# Patient Record
Sex: Female | Born: 1960 | Race: White | Hispanic: No | Marital: Married
Health system: Southern US, Community
[De-identification: ages and names within clinical notes are randomized; demographics above are authoritative.]

---

## 2007-05-27 ENCOUNTER — Ambulatory Visit: Payer: Self-pay | Admitting: Podiatry

## 2010-05-24 ENCOUNTER — Ambulatory Visit: Payer: Self-pay | Admitting: Family Medicine

## 2010-09-14 ENCOUNTER — Emergency Department: Payer: Self-pay | Admitting: Emergency Medicine

## 2011-06-08 ENCOUNTER — Emergency Department: Payer: Self-pay | Admitting: Emergency Medicine

## 2011-11-30 IMAGING — US US EXTREM LOW VENOUS*R*
1 series · 14 of 24 positions shown · non-contrast
Comparison: none

REASON FOR EXAM: CELLULITIS R LEG PAIN EVAL FOR DVT
COMMENTS:

[Series 1: us extrem low venous*right* · 14 of 34 slices shown]
[im 1/34]
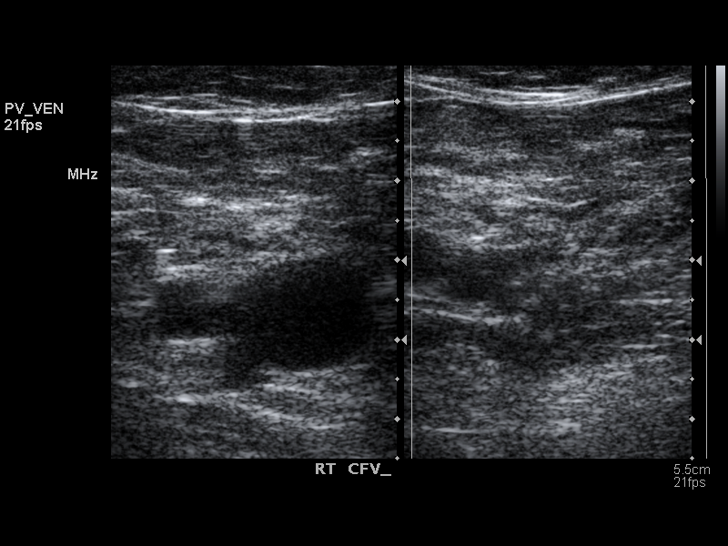
[im 3/34]
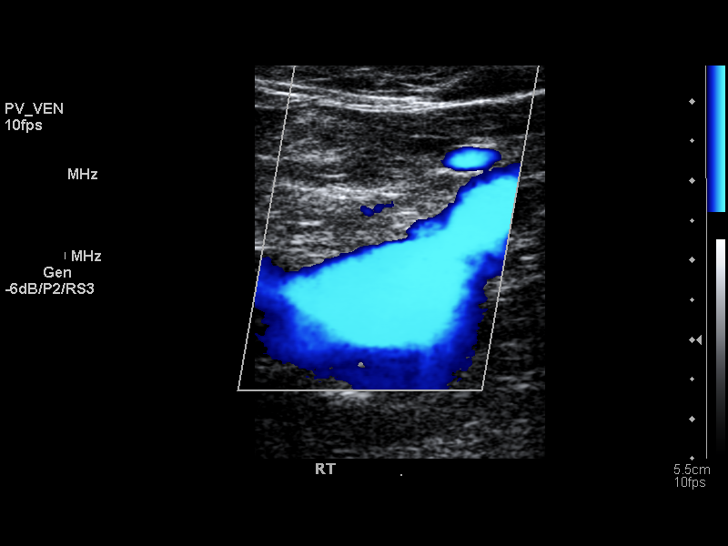
[im 6/34]
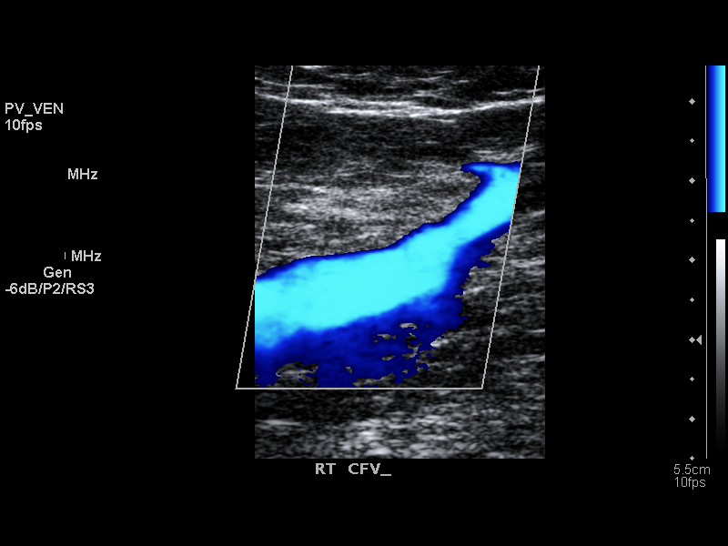
[im 9/34]
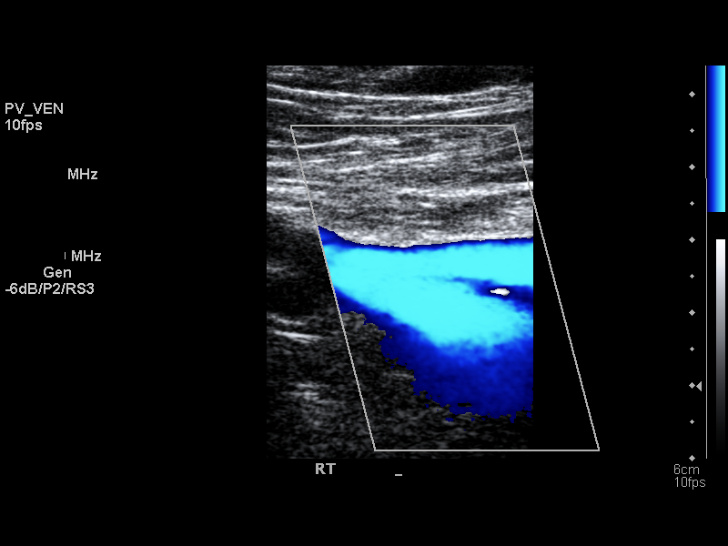
[im 11/34]
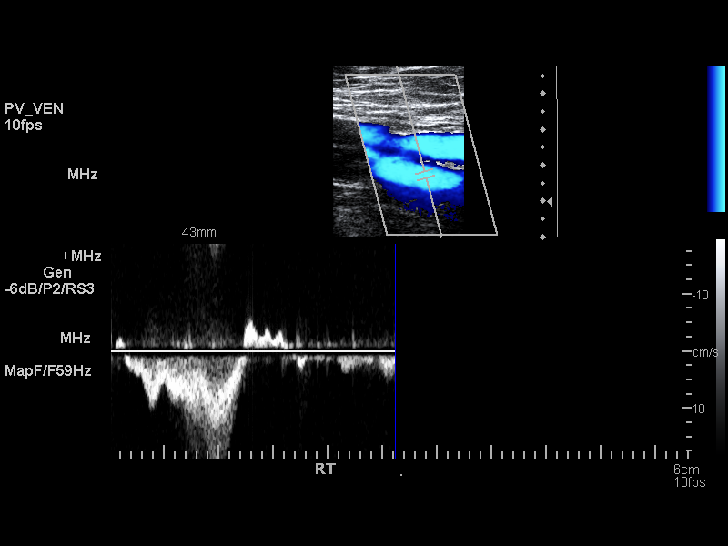
[im 13/34]
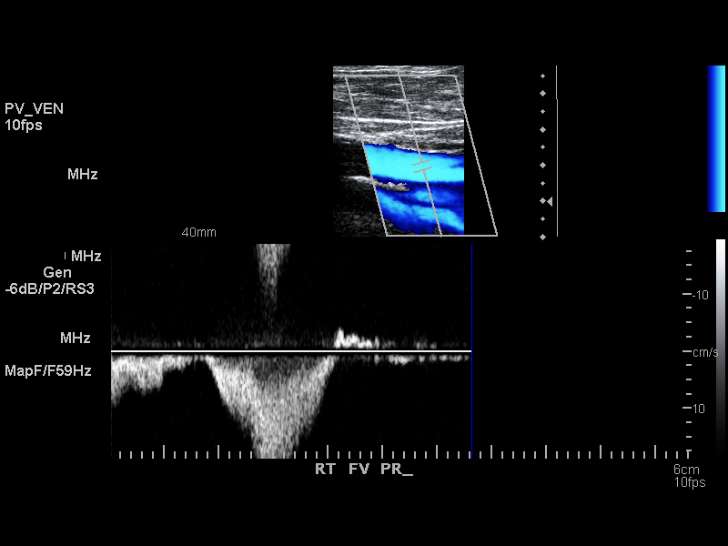
[im 16/34]
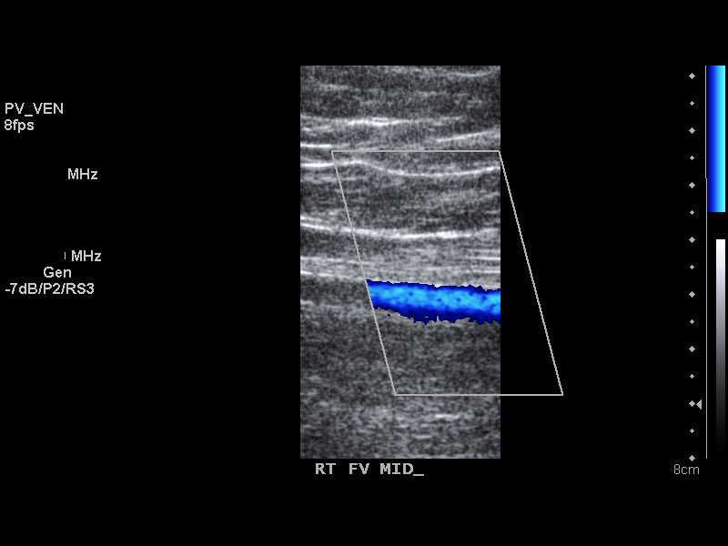
[im 18/34]
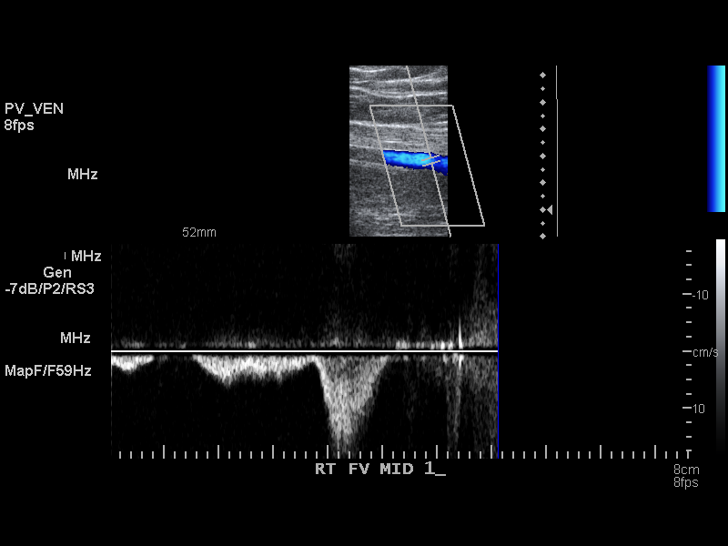
[im 21/34]
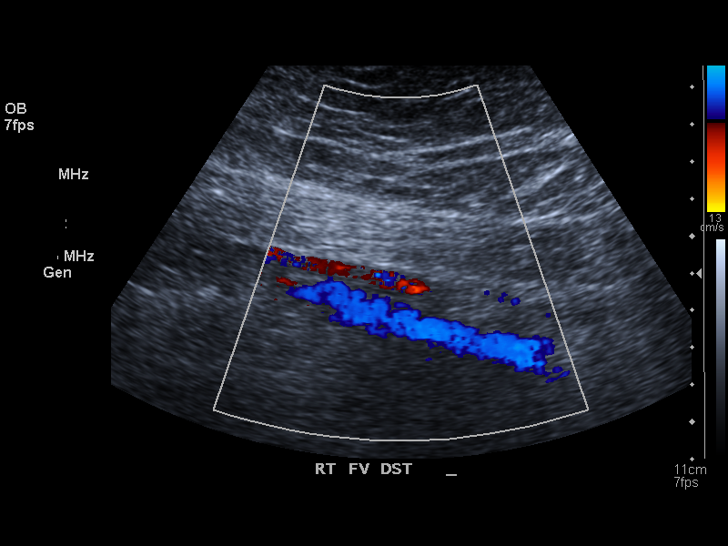
[im 23/34]
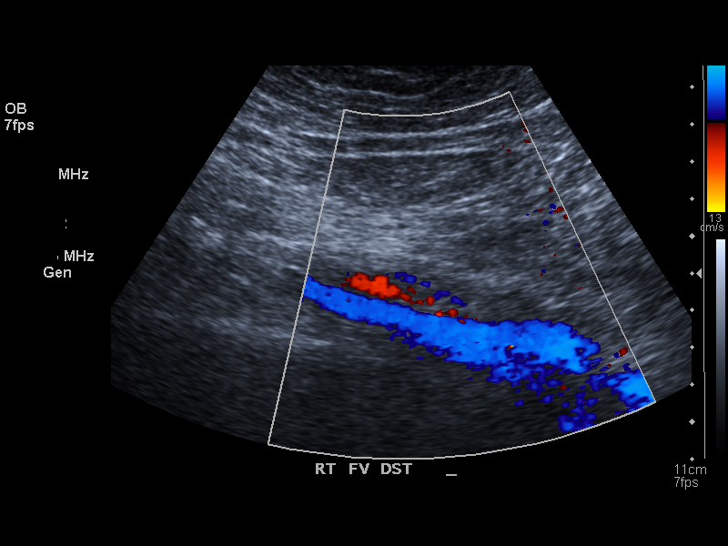
[im 26/34]
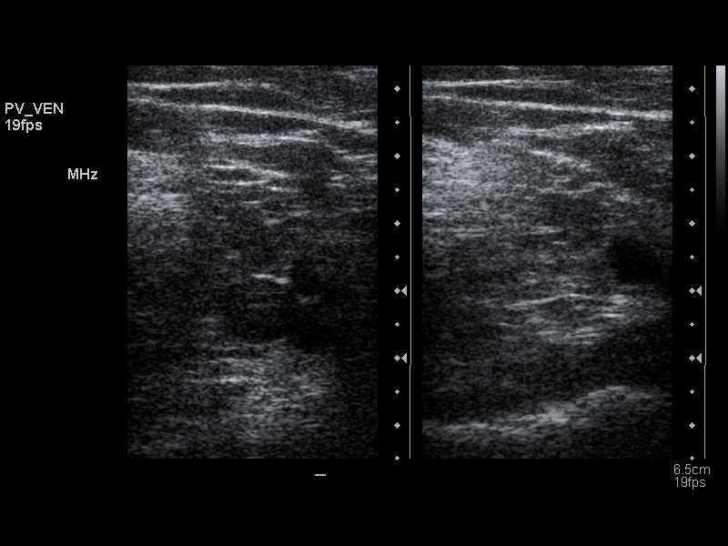
[im 28/34]
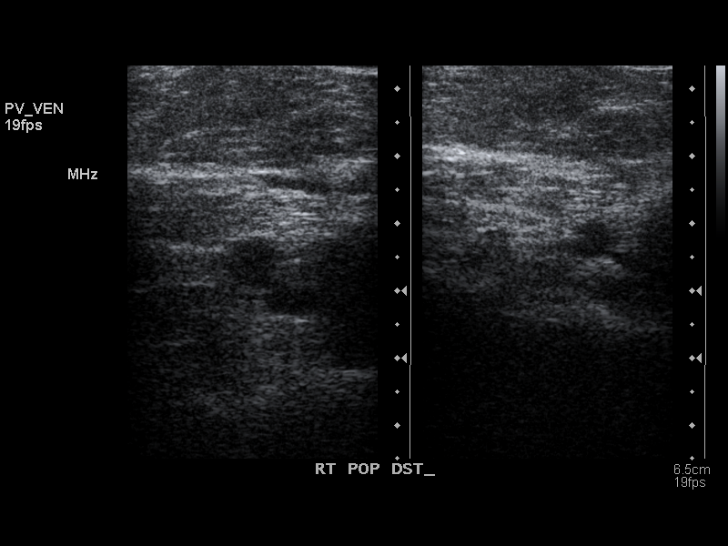
[im 31/34]
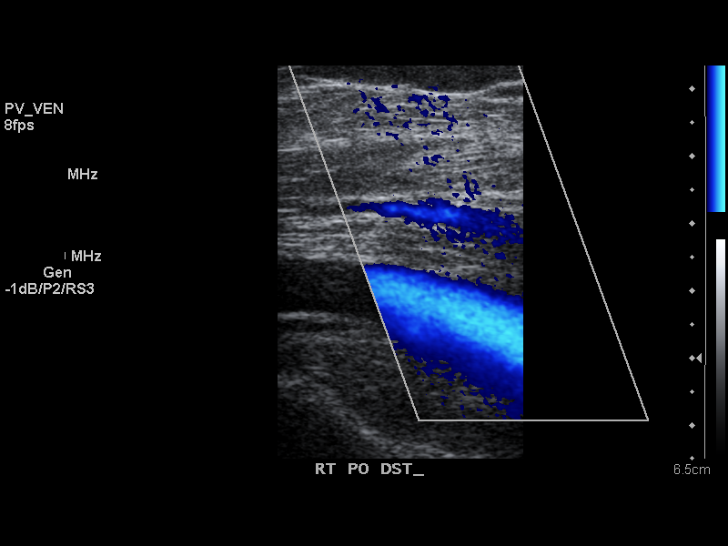
[im 34/34]
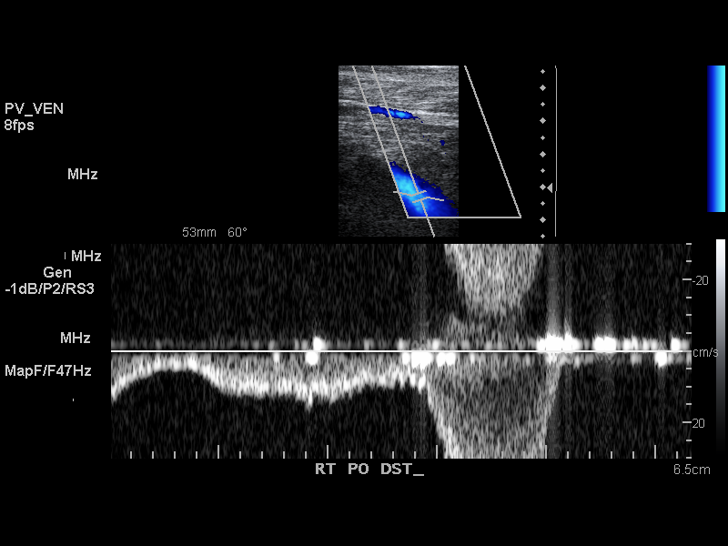

[14 of 24 positions shown; findings below may reference images not displayed]

PROCEDURE:     US  - US DOPPLER LOW EXTR RIGHT  - May 24, 2010  [DATE]

RESULT:     Duplex Doppler interrogation of the deep venous system of the
right leg is performed from the common femoral vein to the popliteal vein.
There is normal compressibility of the deep venous structures. The color
Doppler and spectral Doppler appearance is normal.
IMPRESSION: No evidence of right lower extremity deep vein thrombosis.

## 2012-12-14 IMAGING — CT CT HEAD WITHOUT CONTRAST
2 series · 16 of 30 positions shown, 20 images · non-contrast
Comparison: none

REASON FOR EXAM: fall with hematoma left forehead/headache
COMMENTS:   May transport without cardiac monitor

PROCEDURE:     CT  - CT HEAD WITHOUT CONTRAST  - June 08, 2011 [DATE]
RESULT:     Technique: Helical 5mm sections were obtained from the skull
base to the vertex without administration of intravenous contrast.

[Series 4: without · axial · non-contrast · 0.42mm/px · z∈[+343,+468]mm · 13 of 31 slices shown, 17 images]
[im 3/31  brain]
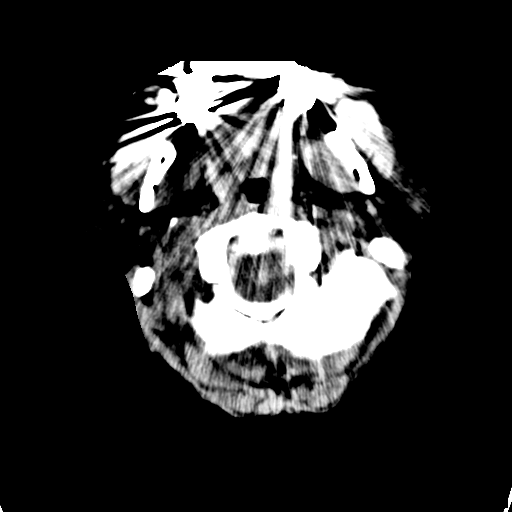
[im 3/31  bone]
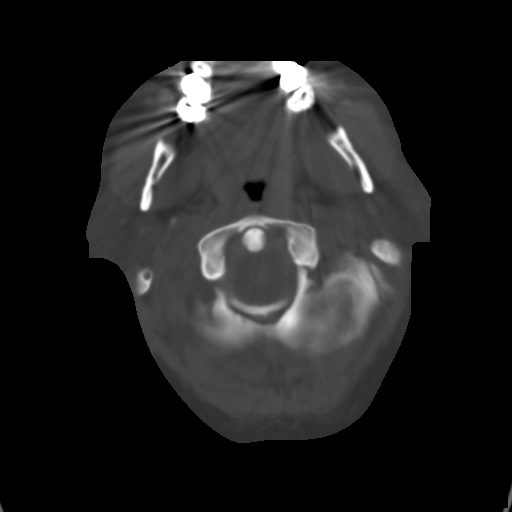
[im 5/31  brain]
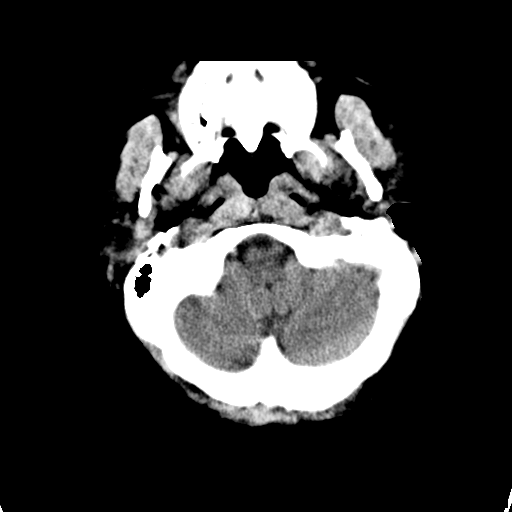
[im 7/31  brain]
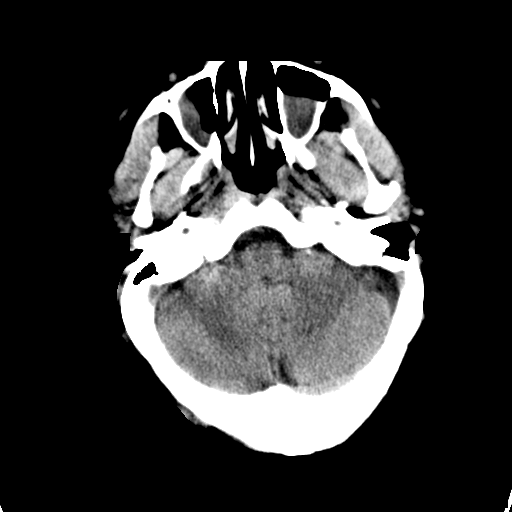
[im 9/31  brain]
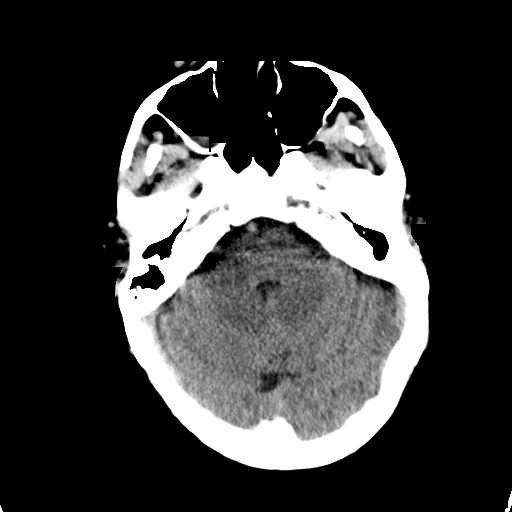
[im 11/31  brain]
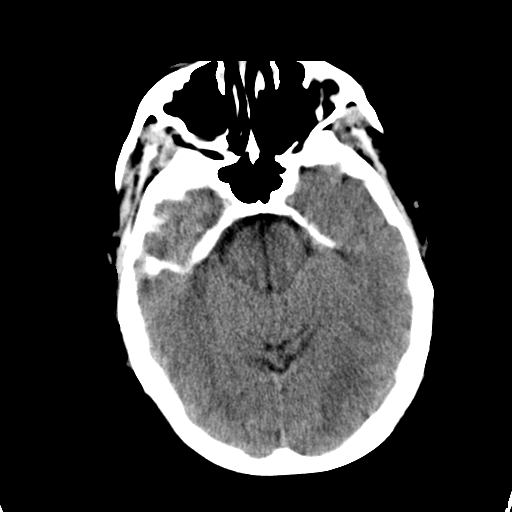
[im 11/31  bone]
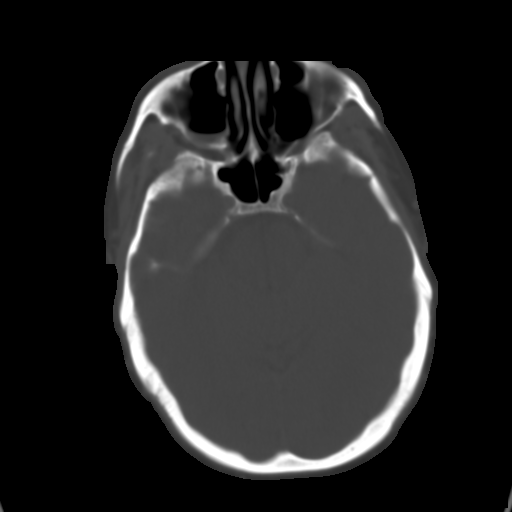
[im 13/31  brain]
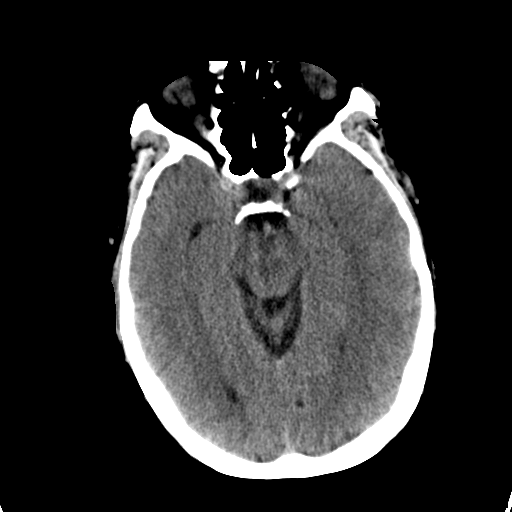
[im 16/31  brain]
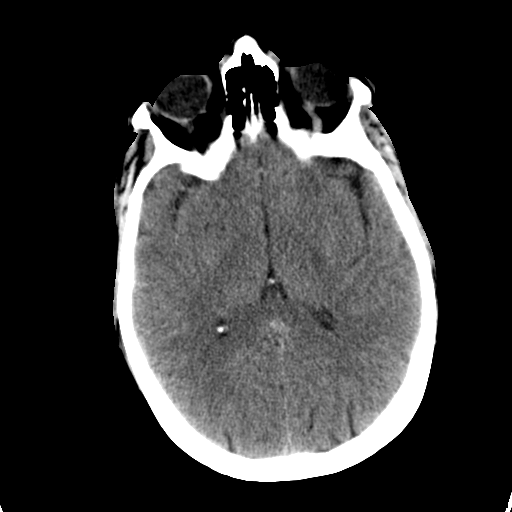
[im 18/31  brain]
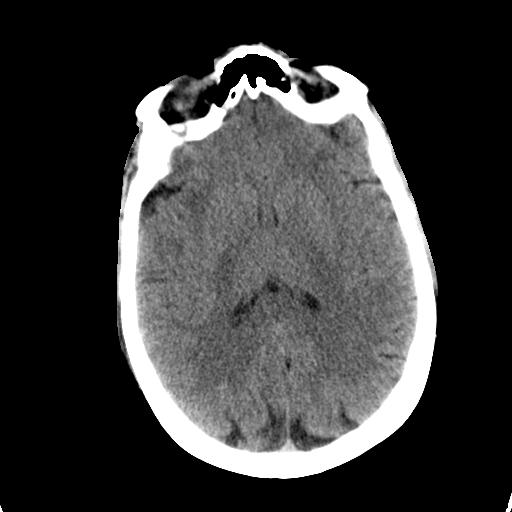
[im 20/31  brain]
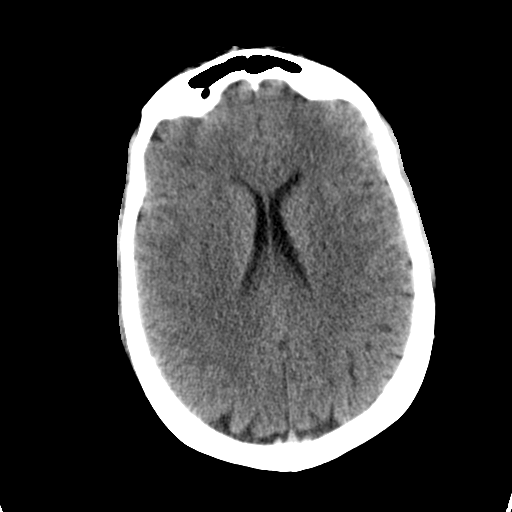
[im 20/31  bone]
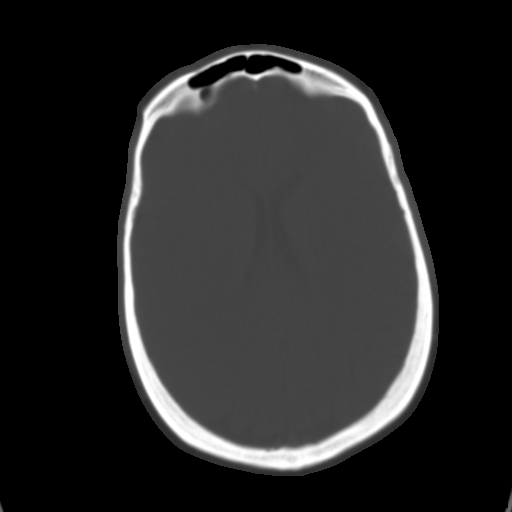
[im 22/31  brain]
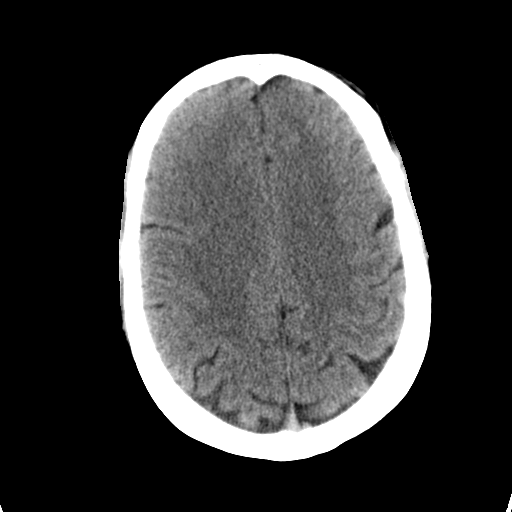
[im 24/31  brain]
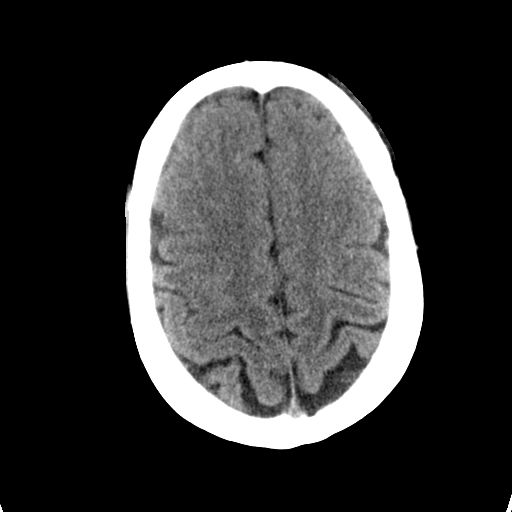
[im 26/31  brain]
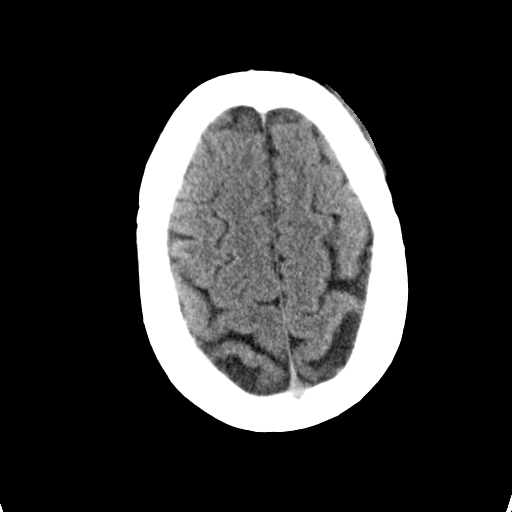
[im 28/31  brain]
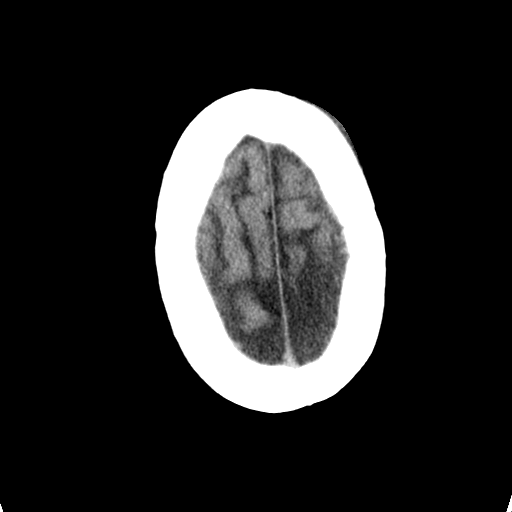
[im 28/31  bone]
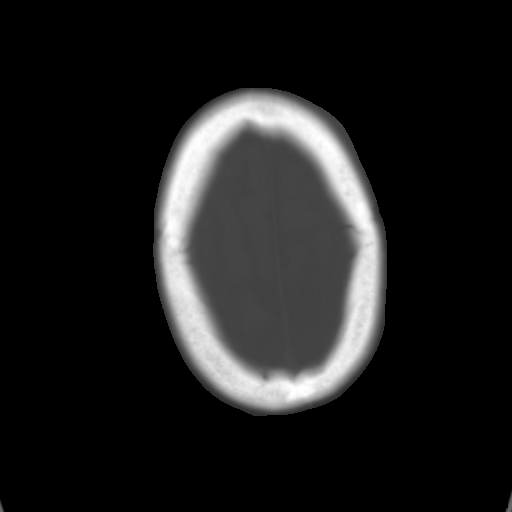

[Series 5: bone · axial · 0.42mm/px · z∈[+343,+383]mm · 3 of 31 slices shown]
[im 3/31  bone]
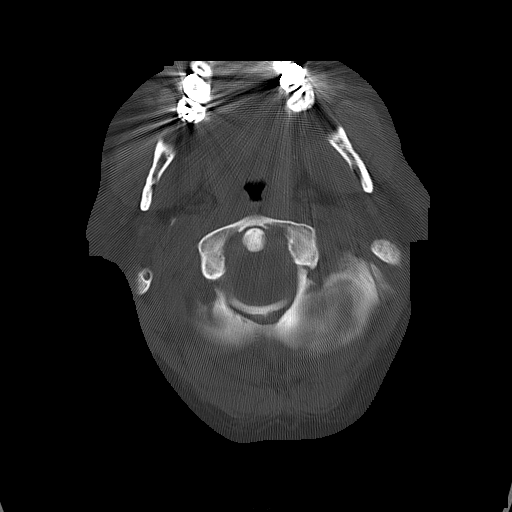
[im 7/31  bone]
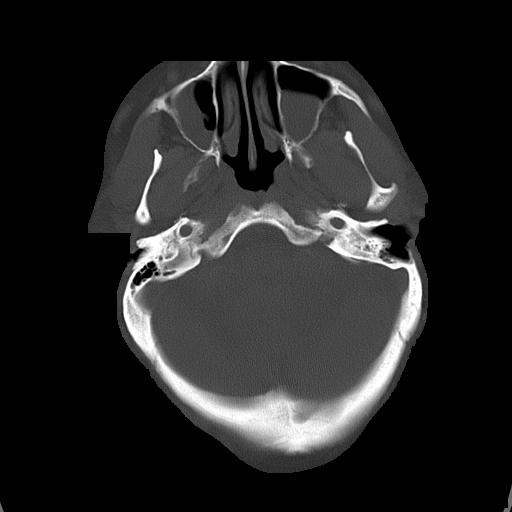
[im 11/31  bone]
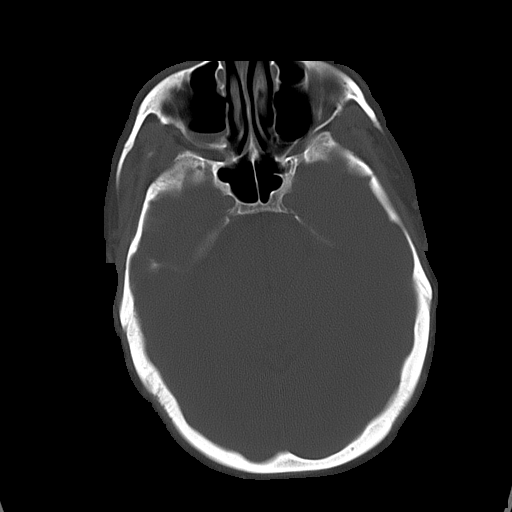

[16 of 30 positions shown; findings below may reference images not displayed]

FINDINGS: There is not evidence of intra-axial fluid collections. There is
no evidence of acute hemorrhage or secondary signs reflecting mass effect or
subacute or chronic focal territorial infarction. The osseous structures
demonstrate no evidence of a depressed skull fracture. If there is
persistent concern clinical follow-up with MRI is recommended.  Scalp
hematoma in the left frontal region.
IMPRESSION: 1. No evidence of acute intracranial abnormalitites.
2. Scalp hematoma.

## 2021-03-20 ENCOUNTER — Other Ambulatory Visit: Payer: Self-pay

## 2021-03-20 ENCOUNTER — Ambulatory Visit (INDEPENDENT_AMBULATORY_CARE_PROVIDER_SITE_OTHER): Payer: BC Managed Care – PPO | Admitting: Dermatology

## 2021-03-20 DIAGNOSIS — L578 Other skin changes due to chronic exposure to nonionizing radiation: Secondary | ICD-10-CM | POA: Diagnosis not present

## 2021-03-20 DIAGNOSIS — D2362 Other benign neoplasm of skin of left upper limb, including shoulder: Secondary | ICD-10-CM

## 2021-03-20 DIAGNOSIS — L814 Other melanin hyperpigmentation: Secondary | ICD-10-CM

## 2021-03-20 DIAGNOSIS — L820 Inflamed seborrheic keratosis: Secondary | ICD-10-CM

## 2021-03-20 NOTE — Patient Instructions (Addendum)
Seborrheic Keratosis (age spots)  What causes seborrheic keratoses? Seborrheic keratoses are harmless, common skin growths that first appear during adult life.  As time goes by, more growths appear.  Some people may develop a large number of them.  Seborrheic keratoses appear on both covered and uncovered body parts.  They are not caused by sunlight.  The tendency to develop seborrheic keratoses can be inherited.  They vary in color from skin-colored to gray, brown, or even black.  They can be either smooth or have a rough, warty surface.   Seborrheic keratoses are superficial and look as if they were stuck on the skin.  Under the microscope this type of keratosis looks like layers upon layers of skin.  That is why at times the top layer may seem to fall off, but the rest of the growth remains and re-grows.    Treatment Seborrheic keratoses do not need to be treated, but can easily be removed in the office.  Seborrheic keratoses often cause symptoms when they rub on clothing or jewelry.  Lesions can be in the way of shaving.  If they become inflamed, they can cause itching, soreness, or burning.  Removal of a seborrheic keratosis can be accomplished by freezing, burning, or surgery. If any spot bleeds, scabs, or grows rapidly, please return to have it checked, as these can be an indication of a skin cancer.  Melanoma ABCDEs  Melanoma is the most dangerous type of skin cancer, and is the leading cause of death from skin disease.  You are more likely to develop melanoma if you: Have light-colored skin, light-colored eyes, or red or blond hair Spend a lot of time in the sun Tan regularly, either outdoors or in a tanning bed Have had blistering sunburns, especially during childhood Have a close family member who has had a melanoma Have atypical moles or large birthmarks  Early detection of melanoma is key since treatment is typically straightforward and cure rates are extremely high if we catch it  early.   The first sign of melanoma is often a change in a mole or a new dark spot.  The ABCDE system is a way of remembering the signs of melanoma.  A for asymmetry:  The two halves do not match. B for border:  The edges of the growth are irregular. C for color:  A mixture of colors are present instead of an even brown color. D for diameter:  Melanomas are usually (but not always) greater than 35mm - the size of a pencil eraser. E for evolution:  The spot keeps changing in size, shape, and color.  Please check your skin once per month between visits. You can use a small mirror in front and a large mirror behind you to keep an eye on the back side or your body.   If you see any new or changing lesions before your next follow-up, please call to schedule a visit.  Please continue daily skin protection including broad spectrum sunscreen SPF 30+ to sun-exposed areas, reapplying every 2 hours as needed when you're outdoors.   Staying in the shade or wearing long sleeves, sun glasses (UVA+UVB protection) and wide brim hats (4-inch brim around the entire circumference of the hat) are also recommended for sun protection.    Recommend taking Heliocare sun protection supplement daily in sunny weather for additional sun protection. For maximum protection on the sunniest days, you can take up to 2 capsules of regular Heliocare OR take 1 capsule of  Heliocare Ultra. For prolonged exposure (such as a full day in the sun), you can repeat your dose of the supplement 4 hours after your first dose. Heliocare can be purchased at [facility] or at [email or web address].     If you have any questions or concerns for your doctor, please call our main line at [phone] and press option 4 to reach your doctor's medical assistant. If no one answers, please leave a voicemail as directed and we will return your call as soon as possible. Messages left after 4 pm will be answered the following business day.   You  may also send [place] a message via [place]. We typically respond to MyChart messages within 1-2 business days.  For prescription refills, please ask your pharmacy to contact our office. Our fax number is [phone].  If you have an urgent issue when the clinic is closed that cannot wait until the next business day, you can page your doctor at the number below.    Please note that while we do our best to be available for urgent issues outside of office hours, we are not available 24/7.   If you have an urgent issue and are unable to reach [place], you may choose to seek medical care at your doctor's office, retail clinic, urgent care center, or emergency room.  If you have a medical emergency, please immediately call 911 or go to the emergency department.  Pager Numbers  - Dr. Nehemiah Massed: [phone]  - Dr. Laurence Ferrari: [phone]  - Dr. Nicole Kindred: [phone]  In the event of inclement weather, please call our main line at [phone] for an update on the status of any delays or closures.  Dermatology Medication Tips: Please keep the boxes that topical medications come in in order to help keep track of the instructions about where and how to use these. Pharmacies typically print the medication instructions only on the boxes and not directly on the medication tubes.   If your medication is too expensive, please contact our office at [phone] option 4 or send [place] a message through [place].   We are unable to tell what your co-pay for medications will be in advance as this is different depending on your insurance coverage. However, we may be able to find a substitute medication at lower cost or fill out paperwork to get insurance to cover a needed medication.   If a prior authorization is required to get your medication covered by your insurance company, please allow [place] 1-2 business days to complete this process.  Drug prices often vary depending on where the prescription is filled and some  pharmacies may offer cheaper prices.  The website www.goodrx.com contains coupons for medications through different pharmacies. The prices here do not account for what the cost may be with help from insurance (it may be cheaper with your insurance), but the website can give you the price if you did not use any insurance.  - You can print the associated coupon and take it with your prescription to the pharmacy.  - You may also stop by our office during regular business hours and pick up a GoodRx coupon card.  - If you need your prescription sent electronically to a different pharmacy, notify our office through [facility] or by phone at [phone] option 4.

## 2021-03-20 NOTE — Progress Notes (Signed)
   New Patient Visit  Subjective  Selena Reynolds is a 60 y.o. female who presents for the following: New Patient (Initial Visit) (Patient here today for concerns about a mole that a friend believes was keratosis. Mole is located at left chest and has been present 4 - 6 months. Reports that some of mole came off in shower a couple weeks ago.  Patient states she also has a flaky spot on right lower leg that has been present for years. Patient reports no personal or family history of skin cancer. She does report having a mole removed on her back 40 years ago but it was ruled benign. ).   The following portions of the chart were reviewed this encounter and updated as appropriate:   Allergies  Meds  Problems  Med Hx  Surg Hx  Fam Hx      Review of Systems:  No other skin or systemic complaints except as noted in HPI or Assessment and Plan.  Objective  Well appearing patient in no apparent distress; mood and affect are within normal limits.  A focused examination was performed including face, right lower leg, left breast, arms. Relevant physical exam findings are noted in the Assessment and Plan.  left breast x 1, right pretibia x 1, Erythematous keratotic or waxy stuck-on papule or plaque.    Assessment & Plan  Inflamed seborrheic keratosis left breast x 1, right pretibia x 1,  Patient defers treatment today  Benign-appearing.  Observation.  Call clinic for new or changing lesions.  Recommend daily use of broad spectrum spf 30+ sunscreen to sun-exposed areas.     Dermatofibroma - Firm pink/brown papulenodule with dimple sign left forearm  - Benign appearing - Call for any changes  Lentigines - Scattered tan macules - Due to sun exposure - Benign-appering, observe - Recommend daily broad spectrum sunscreen SPF 30+ to sun-exposed areas, reapply every 2 hours as needed. - Call for any changes  Actinic Damage - chronic, secondary to cumulative UV radiation exposure/sun  exposure over time - diffuse scaly erythematous macules with underlying dyspigmentation - Recommend daily broad spectrum sunscreen SPF 30+ to sun-exposed areas, reapply every 2 hours as needed.  - Recommend staying in the shade or wearing long sleeves, sun glasses (UVA+UVB protection) and wide brim hats (4-inch brim around the entire circumference of the hat). - Call for new or changing lesions.  Return if symptoms worsen or fail to improve. I, Ruthell Rummage, CMA, am acting as scribe for Forest Gleason, MD.   Documentation: I have reviewed the above documentation for accuracy and completeness, and I agree with the above.  Forest Gleason, MD

## 2021-04-02 ENCOUNTER — Encounter: Payer: Self-pay | Admitting: Dermatology

## 2021-04-17 ENCOUNTER — Other Ambulatory Visit: Payer: Self-pay | Admitting: Internal Medicine

## 2021-04-17 DIAGNOSIS — Z1231 Encounter for screening mammogram for malignant neoplasm of breast: Secondary | ICD-10-CM

## 2021-06-22 ENCOUNTER — Ambulatory Visit
Admission: RE | Admit: 2021-06-22 | Discharge: 2021-06-22 | Disposition: A | Discharge status: Home / Self Care | Payer: BC Managed Care – PPO | Source: Ambulatory Visit | Attending: Physician Assistant | Admitting: Physician Assistant

## 2021-06-22 ENCOUNTER — Other Ambulatory Visit: Payer: Self-pay

## 2021-06-22 ENCOUNTER — Other Ambulatory Visit: Payer: Self-pay | Admitting: Physician Assistant

## 2021-06-22 DIAGNOSIS — R1032 Left lower quadrant pain: Secondary | ICD-10-CM | POA: Insufficient documentation

## 2021-06-22 MED ORDER — IOHEXOL 350 MG/ML SOLN
100.0000 mL | Freq: Once | INTRAVENOUS | Status: AC | PRN
  Administered 2021-06-22: 100 mL via INTRAVENOUS

## 2021-07-24 ENCOUNTER — Encounter (HOSPITAL_COMMUNITY): Payer: Self-pay | Admitting: Radiology

## 2021-10-31 ENCOUNTER — Other Ambulatory Visit: Payer: Self-pay | Admitting: Nurse Practitioner

## 2021-10-31 DIAGNOSIS — R932 Abnormal findings on diagnostic imaging of liver and biliary tract: Secondary | ICD-10-CM

## 2021-10-31 DIAGNOSIS — Z8719 Personal history of other diseases of the digestive system: Secondary | ICD-10-CM

## 2021-11-09 ENCOUNTER — Other Ambulatory Visit: Payer: Self-pay | Admitting: Nurse Practitioner

## 2021-11-09 DIAGNOSIS — R933 Abnormal findings on diagnostic imaging of other parts of digestive tract: Secondary | ICD-10-CM

## 2021-11-13 ENCOUNTER — Other Ambulatory Visit: Payer: BC Managed Care – PPO

## 2022-01-23 ENCOUNTER — Ambulatory Visit: Admit: 2022-01-23 | Payer: BC Managed Care – PPO | Admitting: Internal Medicine

## 2022-01-23 SURGERY — COLONOSCOPY WITH PROPOFOL
Anesthesia: General
